# Patient Record
Sex: Male | Born: 1990 | Race: White | Hispanic: No | Marital: Single | Smoking: Current some day smoker
Health system: Southern US, Community
[De-identification: ages and names within clinical notes are randomized; demographics above are authoritative.]

## PROBLEM LIST (undated history)

## (undated) DIAGNOSIS — J45909 Unspecified asthma, uncomplicated: Secondary | ICD-10-CM

## (undated) DIAGNOSIS — F329 Major depressive disorder, single episode, unspecified: Secondary | ICD-10-CM

## (undated) DIAGNOSIS — F419 Anxiety disorder, unspecified: Secondary | ICD-10-CM

## (undated) DIAGNOSIS — F909 Attention-deficit hyperactivity disorder, unspecified type: Secondary | ICD-10-CM

## (undated) DIAGNOSIS — F32A Depression, unspecified: Secondary | ICD-10-CM

## (undated) HISTORY — DX: Depression, unspecified: F32.A

## (undated) HISTORY — PX: TOENAIL EXCISION: SUR558

## (undated) HISTORY — DX: Attention-deficit hyperactivity disorder, unspecified type: F90.9

## (undated) HISTORY — DX: Anxiety disorder, unspecified: F41.9

---
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
## 2008-03-31 ENCOUNTER — Encounter: Admission: RE | Admit: 2008-03-31 | Discharge: 2008-03-31 | Payer: Self-pay | Admitting: Family Medicine

## 2008-03-31 ENCOUNTER — Ambulatory Visit: Payer: Self-pay | Admitting: Family Medicine

## 2008-03-31 DIAGNOSIS — J1100 Influenza due to unidentified influenza virus with unspecified type of pneumonia: Secondary | ICD-10-CM | POA: Insufficient documentation

## 2008-03-31 DIAGNOSIS — R059 Cough, unspecified: Secondary | ICD-10-CM

## 2008-03-31 DIAGNOSIS — R05 Cough: Secondary | ICD-10-CM

## 2008-03-31 HISTORY — DX: Cough, unspecified: R05.9

## 2008-05-16 ENCOUNTER — Ambulatory Visit: Payer: Self-pay | Admitting: Family Medicine

## 2008-05-19 ENCOUNTER — Telehealth: Payer: Self-pay | Admitting: Family Medicine

## 2008-06-02 ENCOUNTER — Ambulatory Visit: Payer: Self-pay | Admitting: Occupational Medicine

## 2008-06-02 DIAGNOSIS — S62309A Unspecified fracture of unspecified metacarpal bone, initial encounter for closed fracture: Secondary | ICD-10-CM

## 2008-06-02 HISTORY — DX: Unspecified fracture of unspecified metacarpal bone, initial encounter for closed fracture: S62.309A

## 2008-06-03 ENCOUNTER — Telehealth (INDEPENDENT_AMBULATORY_CARE_PROVIDER_SITE_OTHER): Payer: Self-pay | Admitting: *Deleted

## 2008-08-01 ENCOUNTER — Encounter: Admission: RE | Admit: 2008-08-01 | Discharge: 2008-08-01 | Payer: Self-pay | Admitting: Orthopedic Surgery

## 2014-03-31 ENCOUNTER — Encounter: Payer: Self-pay | Admitting: Physician Assistant

## 2014-03-31 ENCOUNTER — Ambulatory Visit: Payer: Self-pay | Admitting: Physician Assistant

## 2014-03-31 ENCOUNTER — Ambulatory Visit (INDEPENDENT_AMBULATORY_CARE_PROVIDER_SITE_OTHER): Payer: PRIVATE HEALTH INSURANCE | Admitting: Physician Assistant

## 2014-03-31 VITALS — BP 136/83 | HR 88 | Temp 98.2°F | Wt 213.0 lb

## 2014-03-31 DIAGNOSIS — J209 Acute bronchitis, unspecified: Secondary | ICD-10-CM

## 2014-03-31 DIAGNOSIS — L858 Other specified epidermal thickening: Secondary | ICD-10-CM

## 2014-03-31 DIAGNOSIS — Q828 Other specified congenital malformations of skin: Secondary | ICD-10-CM

## 2014-03-31 MED ORDER — AZITHROMYCIN 250 MG PO TABS: ORAL_TABLET | ORAL | Status: DC

## 2014-03-31 NOTE — Patient Instructions (Addendum)
Acute Bronchitis Bronchitis is inflammation of the airways that extend from the windpipe into the lungs (bronchi). The inflammation often causes mucus to develop. This leads to a cough, which is the most common symptom of bronchitis.  In acute bronchitis, the condition usually develops suddenly and goes away over time, usually in a couple weeks. Smoking, allergies, and asthma can make bronchitis worse. Repeated episodes of bronchitis may cause further lung problems.  CAUSES Acute bronchitis is most often caused by the same virus that causes a cold. The virus can spread from person to person (contagious) through coughing, sneezing, and touching contaminated objects. SIGNS AND SYMPTOMS   Cough.   Fever.   Coughing up mucus.   Body aches.   Chest congestion.   Chills.   Shortness of breath.   Sore throat.  DIAGNOSIS  Acute bronchitis is usually diagnosed through a physical exam. Your health care provider will also ask you questions about your medical history. Tests, such as chest X-rays, are sometimes done to rule out other conditions.  TREATMENT  Acute bronchitis usually goes away in a couple weeks. Oftentimes, no medical treatment is necessary. Medicines are sometimes given for relief of fever or cough. Antibiotic medicines are usually not needed but may be prescribed in certain situations. In some cases, an inhaler may be recommended to help reduce shortness of breath and control the cough. A cool mist vaporizer may also be used to help thin bronchial secretions and make it easier to clear the chest.  HOME CARE INSTRUCTIONS  Get plenty of rest.   Drink enough fluids to keep your urine clear or pale yellow (unless you have a medical condition that requires fluid restriction). Increasing fluids may help thin your respiratory secretions (sputum) and reduce chest congestion, and it will prevent dehydration.   Take medicines only as directed by your health care provider.  If  you were prescribed an antibiotic medicine, finish it all even if you start to feel better.  Avoid smoking and secondhand smoke. Exposure to cigarette smoke or irritating chemicals will make bronchitis worse. If you are a smoker, consider using nicotine gum or skin patches to help control withdrawal symptoms. Quitting smoking will help your lungs heal faster.   Reduce the chances of another bout of acute bronchitis by washing your hands frequently, avoiding people with cold symptoms, and trying not to touch your hands to your mouth, nose, or eyes.   Keep all follow-up visits as directed by your health care provider.  SEEK MEDICAL CARE IF: Your symptoms do not improve after 1 week of treatment.  SEEK IMMEDIATE MEDICAL CARE IF:  You develop an increased fever or chills.   You have chest pain.   You have severe shortness of breath.  You have bloody sputum.   You develop dehydration.  You faint or repeatedly feel like you are going to pass out.  You develop repeated vomiting.  You develop a severe headache. MAKE SURE YOU:   Understand these instructions.  Will watch your condition.  Will get help right away if you are not doing well or get worse. Document Released: 07/28/2004 Document Revised: 11/04/2013 Document Reviewed: 12/11/2012 [facility] Patient Information 2015 [place], [place]. This information is not intended to replace advice given to you by your health care provider. Make sure you discuss any questions you have with your health care provider.   benzoyl peroxide Consider using dial soap to wash with.  Cerave SA/lac-hydrin for keratoisis pilaris.

## 2014-04-01 NOTE — Progress Notes (Signed)
   Subjective:    Patient ID: Calvin Lewis, male    DOB: 05/01/1991, 23 y.o.   MRN: [id number]  HPI Pt is a 23 yo male who presents to the clinic with cough, congestion for 4 days. His chest is starting to feel tight as well. No fever, chills, n/v/d. Cough productive with green/yellow sputum. Works with [employer] and could not work due to being out of breath. No wheezing.   He has bumpy skin on his arms and wants to know what he can do about it. No pain or itchy.   Here to establish care.   .. Active Ambulatory Problems    Diagnosis Date Noted  . 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 INFLUENZA WITH PNEUMONIA 03/31/2008  . COUGH 03/31/2008  . CLOSED FRACTURE METACARPAL BONE SITE UNSPECIFIED 06/02/2008  . Keratosis pilaris 03/31/2014   Resolved Ambulatory Problems    Diagnosis Date Noted  . No Resolved Ambulatory Problems   No Additional Past Medical History   .Marland Kitchen. Family History  Problem Relation Age of Onset  . Alcohol abuse Mother   . Depression Mother   . Hyperlipidemia Mother   . Alcohol abuse Father   . Heart attack Father   . Hyperlipidemia Father   . Hypertension Father    .Marland Kitchen. History   Social History  . Marital Status: Single    Spouse Name: N/A    Number of Children: N/A  . Years of Education: N/A   Occupational History  . Not on file.   Social History Main Topics  . Smoking status: Current Some Day Smoker  . Smokeless tobacco: Not on file  . Alcohol Use: Yes  . Drug Use: No  . Sexual Activity: Yes   Other Topics Concern  . Not on file   Social History Narrative  . No narrative on file      Review of Systems  All other systems reviewed and are negative.      Objective:   Physical Exam  Constitutional: He is oriented to person, place, and time. He appears well-developed and well-nourished.  HENT:  Head: Normocephalic and atraumatic.  Right Ear: External ear normal.  Left Ear: External ear normal.  Nose: Nose normal.  Mouth/Throat:  Oropharynx is clear and moist. No oropharyngeal exudate.  Eyes: Conjunctivae are normal.  Neck: Normal range of motion. Neck supple.  Cardiovascular: Normal rate, regular rhythm and normal heart sounds.   Pulmonary/Chest: Effort normal and breath sounds normal. He has no wheezes.  Lymphadenopathy:    He has no cervical adenopathy.  Neurological: He is alert and oriented to person, place, and time.  Skin: Skin is dry.  Rough bumpy skin on bilateral lateral arms.   Psychiatric: He has a normal mood and affect. His behavior is normal.          Assessment & Plan:  Acute bronchitis- treated with zpak. HO given. Follow up if worsening or not improving.  Keratosis pilaris- discussed cerave SA or lac-hydrin.

## 2014-04-11 ENCOUNTER — Encounter: Payer: Self-pay | Admitting: Sports Medicine

## 2014-04-11 ENCOUNTER — Ambulatory Visit (INDEPENDENT_AMBULATORY_CARE_PROVIDER_SITE_OTHER): Payer: PRIVATE HEALTH INSURANCE

## 2014-04-11 ENCOUNTER — Ambulatory Visit (INDEPENDENT_AMBULATORY_CARE_PROVIDER_SITE_OTHER): Payer: PRIVATE HEALTH INSURANCE | Admitting: Sports Medicine

## 2014-04-11 VITALS — BP 142/89 | HR 97 | Ht 68.5 in | Wt 214.0 lb

## 2014-04-11 DIAGNOSIS — R05 Cough: Secondary | ICD-10-CM

## 2014-04-11 DIAGNOSIS — R0602 Shortness of breath: Secondary | ICD-10-CM

## 2014-04-11 DIAGNOSIS — R059 Cough, unspecified: Secondary | ICD-10-CM

## 2014-04-11 MED ORDER — BENZONATATE 200 MG PO CAPS: 200.0000 mg | ORAL_CAPSULE | Freq: Three times a day (TID) | ORAL | Status: DC | PRN

## 2014-04-11 MED ORDER — ALBUTEROL SULFATE HFA 108 (90 BASE) MCG/ACT IN AERS: 2.0000 | INHALATION_SPRAY | Freq: Four times a day (QID) | RESPIRATORY_TRACT | Status: DC | PRN

## 2014-04-11 NOTE — Assessment & Plan Note (Signed)
Symptoms do sound like bronchitis, he continues to smoke. Albuterol, chest x-ray, lungs are clear. Benzonatate. Return in 2 weeks if no better at that point we should probably consider pre-and post bronchodilator spirometry.

## 2014-04-11 NOTE — Progress Notes (Signed)
  Subjective:    CC: Cough  HPI: Calvin Lewis is a very pleasant 23 year old male, for the past several week or so he has had a cough, no fevers, chills, mild shortness of breath. He does continue to smoke. Approximately one pack a day, does not desire to quit. No constitutional symptoms. Symptoms are moderate, persistent, cough is nonproductive.  Past medical history, Surgical history, Family history not pertinant except as noted below, Social history, Allergies, and medications have been entered into the medical record, reviewed, and no changes needed.   Review of Systems: No fevers, chills, night sweats, weight loss, chest pain, or shortness of breath.   Objective:    General: Well Developed, well nourished, and in no acute distress.  Neuro: Alert and oriented x3, extra-ocular muscles intact, sensation grossly intact.  HEENT: Normocephalic, atraumatic, pupils equal round reactive to light, neck supple, no masses, no lymphadenopathy, thyroid nonpalpable.  Skin: Warm and dry, no rashes. Cardiac: Regular rate and rhythm, no murmurs rubs or gallops, no lower extremity edema.  Respiratory: Clear to auscultation bilaterally. Not using accessory muscles, speaking in full sentences.  Chest x-ray is clear  Impression and Recommendations:

## 2014-04-25 ENCOUNTER — Ambulatory Visit: Payer: PRIVATE HEALTH INSURANCE | Admitting: Physician Assistant

## 2014-05-17 NOTE — Addendum Note (Signed)
Addended by: Chalmers CaterUTTLE, Donathan Buller H on: 05/17/2014 11:10 AM   Modules accepted: Orders

## 2014-05-21 ENCOUNTER — Ambulatory Visit: Payer: PRIVATE HEALTH INSURANCE | Admitting: Physician Assistant

## 2014-05-21 DIAGNOSIS — Z0289 Encounter for other administrative examinations: Secondary | ICD-10-CM

## 2014-06-18 ENCOUNTER — Encounter: Payer: Self-pay | Admitting: Physician Assistant

## 2014-06-18 ENCOUNTER — Ambulatory Visit (INDEPENDENT_AMBULATORY_CARE_PROVIDER_SITE_OTHER): Payer: PRIVATE HEALTH INSURANCE | Admitting: Physician Assistant

## 2014-06-18 VITALS — BP 133/76 | HR 80 | Ht 68.5 in | Wt 204.0 lb

## 2014-06-18 DIAGNOSIS — S92302D Fracture of unspecified metatarsal bone(s), left foot, subsequent encounter for fracture with routine healing: Secondary | ICD-10-CM

## 2014-06-18 HISTORY — DX: Fracture of unspecified metatarsal bone(s), left foot, subsequent encounter for fracture with routine healing: S92.302D

## 2014-06-18 MED ORDER — HYDROCODONE-ACETAMINOPHEN 7.5-325 MG PO TABS: 1.0000 | ORAL_TABLET | Freq: Three times a day (TID) | ORAL | Status: DC | PRN

## 2014-06-18 NOTE — Progress Notes (Signed)
   Subjective:    Patient ID: Calvin Lewis, male    DOB: 11/17/1990, 23 y.o.   MRN: [id number]  HPI  Pt is a 23 yo male who presents to the clinic for hospital follow up after jumping of ledge and landing on left foot. [facility] and confirmed 1st metatarsal fracture. Given naprosyn and norco and placed in post-op boot. Continues to have 5/10 pain. Feels like norco is not working like it should. On crutches currently.     Review of Systems  All other systems reviewed and are negative.      Objective:   Physical Exam  Constitutional: He is oriented to person, place, and time. He appears well-developed and well-nourished.  Cardiovascular: Normal rate, regular rhythm and normal heart sounds.   Musculoskeletal:  Pain to palpation over base of 1st metatarsal with corresponding bruise on plantar aspect of left foot.  Swollen and more bruising noted over base of 2nd-4th metatarsals.  Normal ROM at ankle.  Sensations intact.   Neurological: He is alert and oriented to person, place, and time.  Psychiatric: He has a normal mood and affect. His behavior is normal.          Assessment & Plan:  Closed fracture of 1st metatarsal- increased norco to 7.5/325. Stop naprosyn may increase healing time. Stay in post-op shoe for next 4 weeks. Pt has to wear closed toe shoes at work. Will write out for 4 weeks until follow up and re-xray and make sure can bear weight. Can use crutches for next week then bear weight as tolerated.

## 2014-06-18 NOTE — Patient Instructions (Signed)
7-14 non weight bearing with crutches.  4 weeks re xray and evaluate to get out of post-op boot.

## 2014-06-25 ENCOUNTER — Ambulatory Visit: Payer: PRIVATE HEALTH INSURANCE | Admitting: Physician Assistant

## 2014-06-25 DIAGNOSIS — Z0289 Encounter for other administrative examinations: Secondary | ICD-10-CM

## 2014-07-28 ENCOUNTER — Ambulatory Visit (INDEPENDENT_AMBULATORY_CARE_PROVIDER_SITE_OTHER): Payer: PRIVATE HEALTH INSURANCE

## 2014-07-28 ENCOUNTER — Encounter: Payer: Self-pay | Admitting: Physician Assistant

## 2014-07-28 ENCOUNTER — Ambulatory Visit (INDEPENDENT_AMBULATORY_CARE_PROVIDER_SITE_OTHER): Payer: PRIVATE HEALTH INSURANCE | Admitting: Physician Assistant

## 2014-07-28 VITALS — BP 125/75 | HR 102 | Wt 210.0 lb

## 2014-07-28 DIAGNOSIS — S92302D Fracture of unspecified metatarsal bone(s), left foot, subsequent encounter for fracture with routine healing: Secondary | ICD-10-CM

## 2014-07-28 MED ORDER — HYDROCODONE-ACETAMINOPHEN 7.5-325 MG PO TABS: 1.0000 | ORAL_TABLET | Freq: Three times a day (TID) | ORAL | Status: DC | PRN

## 2014-07-29 NOTE — Progress Notes (Signed)
   Subjective:    Patient ID: Calvin Lewis, male    DOB: 12/20/1990, 24 y.o.   MRN: [id number]  HPI Pt presents to the clinic for 4 week follow up on left foot fracture. Not gone back to work. Took himself out of post-op boot and wearing normal shoes. Still has days of pain. Worse when been on feet a lot. Only using hydrocodone for pain.    Review of Systems  All other systems reviewed and are negative.      Objective:   Physical Exam  Musculoskeletal:  Left foot- Good ROM and strength.  No swelling or bruising.  Some discomfort with palpation over base of 1st metatarsal to deep palpation.           Assessment & Plan:  Closed fracture of left foot- Xray done today. Appears to be healing well. Pt is already taken himself out of post-op boot. Still has some residual foot pain worse on some days than others. Can add NSAID back at this point since healed. Refilled a few more hydrocodone for acute pain relief. Discussed will not give any more. I do think he is released to go back to work wearing normal shoes. Follow up as needed.

## 2015-03-11 ENCOUNTER — Ambulatory Visit (INDEPENDENT_AMBULATORY_CARE_PROVIDER_SITE_OTHER): Payer: PRIVATE HEALTH INSURANCE | Admitting: Family Medicine

## 2015-03-11 ENCOUNTER — Encounter: Payer: Self-pay | Admitting: Family Medicine

## 2015-03-11 VITALS — BP 134/89 | HR 80 | Temp 98.1°F | Ht 68.5 in | Wt 207.0 lb

## 2015-03-11 DIAGNOSIS — J209 Acute bronchitis, unspecified: Secondary | ICD-10-CM | POA: Diagnosis not present

## 2015-03-11 HISTORY — DX: Acute bronchitis, unspecified: J20.9

## 2015-03-11 MED ORDER — AZITHROMYCIN 250 MG PO TABS: 250.0000 mg | ORAL_TABLET | Freq: Every day | ORAL | Status: DC

## 2015-03-11 MED ORDER — PREDNISONE 10 MG PO TABS: 30.0000 mg | ORAL_TABLET | Freq: Every day | ORAL | Status: DC

## 2015-03-11 MED ORDER — GUAIFENESIN-CODEINE 100-10 MG/5ML PO SOLN: 5.0000 mL | Freq: Every evening | ORAL | Status: DC | PRN

## 2015-03-11 NOTE — Progress Notes (Signed)
Calvin Lewis is a 24 y.o. male who presents to [facility]  today for cough congestion sore throat and sinus pressure. Symptoms present for 5 days. He uses albuterol inhaler which helped a little. Cough is productive. Patient notes minimal wheezing but does not note significant shortness of breath. No chest pains or palpitations. He's tried some over-the-counter medicines which helped some too.   No past medical history on file. No past surgical history on file. Social History  Substance Use Topics  . Smoking status: Current Some Day Smoker  . Smokeless tobacco: Not on file  . Alcohol Use: Yes   family history includes Alcohol abuse in his father and mother; Depression in his mother; Heart attack in his father; Hyperlipidemia in his father and mother; Hypertension in his father.  ROS as above Medications: Current Outpatient Prescriptions  Medication Sig Dispense Refill  . albuterol (PROVENTIL HFA;VENTOLIN HFA) 108 (90 BASE) MCG/ACT inhaler Inhale 2 puffs into the lungs every 6 (six) hours as needed for wheezing. 2 Inhaler 11  . azithromycin (ZITHROMAX) 250 MG tablet Take 1 tablet (250 mg total) by mouth daily. Take first 2 tablets together, then 1 every day until finished. 6 tablet 0  . guaiFENesin-codeine 100-10 MG/5ML syrup Take 5 mLs by mouth at bedtime as needed for cough. 120 mL 0  . predniSONE (DELTASONE) 10 MG tablet Take 3 tablets (30 mg total) by mouth daily. 15 tablet 0   No current facility-administered medications for this visit.   No Known Allergies   Exam:  BP 134/89 mmHg  Pulse 80  Temp(Src) 98.1 F (36.7 C) (Oral)  Ht 5' 8.5" (1.74 m)  Wt 207 lb (93.895 kg)  BMI 31.01 kg/m2  SpO2 99% Gen: Well NAD HEENT: EOMI,  MMM posterior pharynx with cobblestoning. Normal tympanic membranes bilaterally. Lungs: Normal work of breathing. CTABL Heart: RRR no MRG Abd: NABS, Soft. Nondistended, Nontender Exts: Brisk capillary refill, warm and  well perfused.   No results found for this or any previous visit (from the past 24 hour(s)). No results found.   Assessment and plan: 24 year old male with bronchitis. Likely viral. Treat with prednisone and codeine cough syrup. Patient declined Atrovent nasal spray. Use azithromycin if not better.

## 2015-03-11 NOTE — Patient Instructions (Signed)
Thank you for coming in today. Call or go to the emergency room if you get worse, have trouble breathing, have chest pains, or palpitations.  Use albuterol as needed.  Take prednisone daily.  Use azithromycin if not better.   Acute Bronchitis Bronchitis is inflammation of the airways that extend from the windpipe into the lungs (bronchi). The inflammation often causes mucus to develop. This leads to a cough, which is the most common symptom of bronchitis.  In acute bronchitis, the condition usually develops suddenly and goes away over time, usually in a couple weeks. Smoking, allergies, and asthma can make bronchitis worse. Repeated episodes of bronchitis may cause further lung problems.  CAUSES Acute bronchitis is most often caused by the same virus that causes a cold. The virus can spread from person to person (contagious) through coughing, sneezing, and touching contaminated objects. SIGNS AND SYMPTOMS   Cough.   Fever.   Coughing up mucus.   Body aches.   Chest congestion.   Chills.   Shortness of breath.   Sore throat.  DIAGNOSIS  Acute bronchitis is usually diagnosed through a physical exam. Your health care provider will also ask you questions about your medical history. Tests, such as chest X-rays, are sometimes done to rule out other conditions.  TREATMENT  Acute bronchitis usually goes away in a couple weeks. Oftentimes, no medical treatment is necessary. Medicines are sometimes given for relief of fever or cough. Antibiotic medicines are usually not needed but may be prescribed in certain situations. In some cases, an inhaler may be recommended to help reduce shortness of breath and control the cough. A cool mist vaporizer may also be used to help thin bronchial secretions and make it easier to clear the chest.  HOME CARE INSTRUCTIONS  Get plenty of rest.   Drink enough fluids to keep your urine clear or pale yellow (unless you have a medical condition that  requires fluid restriction). Increasing fluids may help thin your respiratory secretions (sputum) and reduce chest congestion, and it will prevent dehydration.   Take medicines only as directed by your health care provider.  If you were prescribed an antibiotic medicine, finish it all even if you start to feel better.  Avoid smoking and secondhand smoke. Exposure to cigarette smoke or irritating chemicals will make bronchitis worse. If you are a smoker, consider using nicotine gum or skin patches to help control withdrawal symptoms. Quitting smoking will help your lungs heal faster.   Reduce the chances of another bout of acute bronchitis by washing your hands frequently, avoiding people with cold symptoms, and trying not to touch your hands to your mouth, nose, or eyes.   Keep all follow-up visits as directed by your health care provider.  SEEK MEDICAL CARE IF: Your symptoms do not improve after 1 week of treatment.  SEEK IMMEDIATE MEDICAL CARE IF:  You develop an increased fever or chills.   You have chest pain.   You have severe shortness of breath.  You have bloody sputum.   You develop dehydration.  You faint or repeatedly feel like you are going to pass out.  You develop repeated vomiting.  You develop a severe headache. MAKE SURE YOU:   Understand these instructions.  Will watch your condition.  Will get help right away if you are not doing well or get worse. Document Released: 07/28/2004 Document Revised: 11/04/2013 Document Reviewed: 12/11/2012 [facility] Patient Information 2015 [place], [place]. This information is not intended to replace advice given to you  by your health care provider. Make sure you discuss any questions you have with your health care provider.  

## 2015-07-09 ENCOUNTER — Encounter: Payer: Self-pay | Admitting: Osteopathic Medicine

## 2015-07-09 ENCOUNTER — Ambulatory Visit (INDEPENDENT_AMBULATORY_CARE_PROVIDER_SITE_OTHER): Payer: PRIVATE HEALTH INSURANCE | Admitting: Osteopathic Medicine

## 2015-07-09 VITALS — BP 134/81 | HR 103 | Temp 97.9°F | Wt 210.0 lb

## 2015-07-09 DIAGNOSIS — J111 Influenza due to unidentified influenza virus with other respiratory manifestations: Secondary | ICD-10-CM | POA: Diagnosis not present

## 2015-07-09 DIAGNOSIS — J029 Acute pharyngitis, unspecified: Secondary | ICD-10-CM | POA: Diagnosis not present

## 2015-07-09 DIAGNOSIS — R69 Illness, unspecified: Principal | ICD-10-CM

## 2015-07-09 DIAGNOSIS — J019 Acute sinusitis, unspecified: Secondary | ICD-10-CM

## 2015-07-09 DIAGNOSIS — R059 Cough, unspecified: Secondary | ICD-10-CM

## 2015-07-09 DIAGNOSIS — R52 Pain, unspecified: Secondary | ICD-10-CM | POA: Diagnosis not present

## 2015-07-09 DIAGNOSIS — R05 Cough: Secondary | ICD-10-CM

## 2015-07-09 LAB — POCT RAPID STREP A (OFFICE): RAPID STREP A SCREEN: NEGATIVE

## 2015-07-09 LAB — POCT INFLUENZA A/B
INFLUENZA A, POC: NEGATIVE
INFLUENZA B, POC: NEGATIVE

## 2015-07-09 MED ORDER — AMBULATORY NON FORMULARY MEDICATION: Status: DC

## 2015-07-09 MED ORDER — IPRATROPIUM BROMIDE 0.03 % NA SOLN: 2.0000 | Freq: Two times a day (BID) | NASAL | Status: DC

## 2015-07-09 MED ORDER — GUAIFENESIN-CODEINE 100-10 MG/5ML PO SYRP: 5.0000 mL | ORAL_SOLUTION | Freq: Three times a day (TID) | ORAL | Status: DC | PRN

## 2015-07-09 NOTE — Patient Instructions (Signed)
DR. Carvel Huskins'S HOME CARE INSTRUCTIONS: UPPER RESPIRATORY ILLNESS AND SINUSITIS   FRIST, A FEW NOTES ON OVER-THE-COUNTER MEDICATIONS!  . USE CAUTION - MANY OVER-THE-COUNTER MEDICATIONS COME IN COMBINATIONS OF MULTIPLE GENERICS. FOR INSTANCE, NYQUIL HAS TYLENOL + COUGH MEDICINE + AN ANTIHISTAMINE, SO BE CAREFUL YOU'RE NOT TAKING A COMBINATION MEDICINE WHICH CONTAINS MEDICATIONS YOU'RE ALSO TAKING SEPARATELY (LIKE NYQUIL SYRUP AS WELL AS TYLENOL PILLS).  . YOUR PHARMACIST CAN HELP YOU AVOID MEDICATION INTERACTIONS AND DUPLICATIONS - ASK FOR THEIR HELP IF YOU ARE CONFUSED OR UNSURE ABOUT WHAT TO PURCHASE OVER-THE-COUNTER!  . REMEMBER - IF YOU'RE EVER CONCERNED ABOUT MEDICATION SIDE EFFECTS, OR IF YOU'RE EVER CONCERNED YOUR SYMPTOMS ARE GETTING WORSE DESPITE TREATMENT, PLEASE CALL THE OFFICE!   TREAT SINUS CONGESTION, RUNNY NOSE & POSTNASAL DRIP: . Treat to increase airflow through sinuses, decrease congestion pain and prevent bacterial growth!  . Remember, only 0.5-2% of sinus infections are due to a bacteria, the rest are due to a virus (usually the common cold)! Trust your doctor when he or she decides whether or not you really need an antibiotic!   NASAL SPRAYS: generally safe and should not interact with other medicines. Can take any of these medications, either alone or together... FLONASE (FLUTICASONE) - 2 sprays in each nostril twice per day (also a great allergy medicine to use long-term!) AFRIN (OXYMETOLAZONE) - use sparingly because it will cause rebound congestion, NEVER USE IN KIDS   SALINE NASAL SPRAY- no limit, but avoid use after other nasal sprays or it can wash the medicine away  ANTIHISTAMINES: Helps dry out runny nose and decreases postnasal drip. Benadryl may cause drowsiness but other preparations should not be as sedating. Certain kinds are not as safe in elderly individuals. OK to use unless Dr A says otherwise.  Only use one of the following... BENADRYL (DIPHENHYDRAMINE) -  25-50 mg every 6 hours ZYRTEC (CETIRIZINE) - 5-10 mg daily CLARITIN (LORATIDINE) - less potent. 10 mg daily ALLEGRA (FEXOFENADINE) - least likely to cause drowsiness! 180 mg daily or 60 mg twice per day  DECONGESTANTS: Helps dry out runny nose and helps with sinus pain. May cause insomnia, or sometimes elevated heart rate. Can cause problems if used often in people with high blood pressure. OK to use unless Dr A says otherwise. NEVER USE IN KIDS UNDER 2 YEARS OLD. Only use one of the following... SUDAFED (PSEUDOEPHEDINE) - 60 mg every 4 - 6 hours, also comes in 120 mg extended release every 12 hours, maximum 240 mg in 24 hours.  SUDAED PE (PHENYLEPHRINE) - 10 mg every 4 - 6 hours, maximum 60 mg per day  COMBINATIONS OF ANTIHISTAMINE + DECONGESTANT: these usually require you to show your ID at the pharmacy counter. You can also purchase these medicines separately as noted above.  Only use one of the following... ZYRTEC-D (CETIRIZINE + PSEUDOEPHEDRINE) - 12 hour formulation as directed CLARITIN-D (LORATIDINE + PSEUDOEPHEDRINE) - 12 and 24 hour formulations as directed ALLEGRA-D (FEXOFENADINE + PSEUDOEPHEDRINE) - 12 and 24 hour formulations as directed  PRESCRIPTION TREATMENT FOR SINUS PROBLEMS: Can include nasal sprays, pills, or antibiotics in the case of true bacterial infection. Not everyone needs an antibiotic but there are other medicines which will help you feel better while your body fights the infection!   TREAT COUGH & SORE THROAT: . Remember, cough is the body's way of protecting your airways and lungs, it's a hard-wired reflex that is tough for medicines to treat!  . Irritation to the airways will cause   cough. This irritation is usually caused by upper airway problems like postnasal drip (treat as above) and viral sore throat, but in severe cases can be due to lower airway problems like bronchitis or pneumonia, which a doctor can usually hear on exam of your lungs or an X-ray. . Sore  throat is almost always due to a virus, but occasionally caused by Strep, which requires antibiotics.  . Exercise and smoking may make cough worse - take it easy while you're sick, and QUIT SMOKING!   . Cough due to viral infection can linger for 2 weeks or so. If you're coughing longer than you think you should, or if the cough is severe, please make an appointment in the office - you may need a chest X-ray.   EXPECTORANT: Used to help clear airways, take these with PLENTY of water to help thin mucus secretions and make the mucus easier to cough up   Only use one of the following... ROBITUSSIN (DEXTROMETHORPHAN OR GUAIFENISEN depending on formulation)  MUCINEX (GUAIFENICEN) - usually longer acting  COUGH DROPS/LOZENGES: Whichever over-the-counter agent you prefer!  Here are some suggestions for ingredients to look for (can take both)... BENZOCAINE - numbing effect, also in CHLORASEPTIC THROAT SPRAY MENTHOL - cooling effect  HONEY: has gone head-to-head in several clinical trials with prescription cough medicines and found to be equally effective! Try 1 Teaspoon Honey + 2 Drops Lemon Juice, as much as you want to use. NONE FOR KIDS UNDER AGE 2  HERBAL TEA: There are certain ingredients which help "coat the throat" to relieve pain  such as ELM BARK, LICORICE ROOT, MARSHMALLOW ROOT  PRESCRIPTION TREATMENT FOR COUGH: Reserved for severe cases. Can include pills, syrups or inhalers.    TREAT ACHES & PAINS, FEVER: . Illness causes aches, pains and fever as your body increases its natural inflammation response to help fight the infection.  . Rest, good hydration and nutrition, and taking anti-inflammatory medications will help.  . Remember: a true fever is a temperature 100.4 or higher. If you have a fever that is 105.0 or higher, that is a dangerous level and needs medical attention in the office or in the ER!    Can take both of these together... IBUPROFEN - 400-800 mg every 6 - 8 hours.  Ibuprofen and similar medications can cause problems for people with heart disease or history of stomach ulcers, check with Dr A first if you're concerned. Lower doses are usually safe and effective.  TYLENOL (ACETAMINOPHEN) - 500-1000 mg every 6 hours. It won't cause problems with heart or stomach.     REMEMBER - THE MOST IMPORTANT THINGS YOU CAN DO TO AVOID CATCHING OR SPREADING ILLNESS INCLUDE:  . COVER YOUR COUGH WITH YOUR ARM, NOT WITH YOUR HANDS!  . DISINFECT COMMONLY USED SURFACES (SUCH AS TELEPHONES & DOORKNOBS) WHEN YOU OR SOMEONE CLOSE TO YOU IS FEELING SICK!  . BE SURE VACCINES ARE UP TO DATE - GET A FLU SHOT EVERY YEAR! . GOOD NUTRITION AND HEALTHY LIFESTYLE WILL HELP YOUR IMMUNE SYSTEM YEAR-ROUND! . AND ABOVE ALL - WASH YOUR HANDS! 

## 2015-07-09 NOTE — Progress Notes (Signed)
HPI: Calvin Lewis is a 25 y.o. male who [facility] [place]  today for chief complaint of:  Chief Complaint  Patient presents with  . Sore Throat  . Cough    . Location: sinuses, throat . Quality: pressure, sore throat, body aches  . Severity: mild to moderate . Duration: 1 days . Context: no sick contacts, no recent travel . Modifying factors: has tried the following OTC medications: none without relief . Assoc signs/symptoms: no fever/chills, no productive cough, Yes  body aches, normal GI upset form    Past medical, social and family history reviewed: No past medical history on file. No past surgical history on file. Social History  Substance Use Topics  . Smoking status: Current Some Day Smoker  . Smokeless tobacco: Not on file  . Alcohol Use: Yes   Family History  Problem Relation Age of Onset  . Alcohol abuse Mother   . Depression Mother   . Hyperlipidemia Mother   . Alcohol abuse Father   . Heart attack Father   . Hyperlipidemia Father   . Hypertension Father     Current Outpatient Prescriptions  Medication Sig Dispense Refill  . albuterol (PROVENTIL HFA;VENTOLIN HFA) 108 (90 BASE) MCG/ACT inhaler Inhale 2 puffs into the lungs every 6 (six) hours as needed for wheezing. 2 Inhaler 11   No current facility-administered medications for this visit.   No Known Allergies    Review of Systems: CONSTITUTIONAL: no fever/chills HEAD/EYES/EARS/NOSE/THROAT: yes headache, no vision change or hearing change, yes sore throat CARDIAC: No chest pain/pressure/palpitations, no orthopnea RESPIRATORY: yes cough, no shortness of breath GASTROINTESTINAL: yes nausea, no vomiting, no abdominal pain/blood in stool/diarrhea/constipation MUSCULOSKELETAL: yes myalgia/arthralgia   Exam:  BP 134/81 mmHg  Pulse 103  Temp(Src) 97.9 F (36.6 C) (Oral)  Wt 210 lb (95.255 kg)  SpO2 99% Constitutional: VSS, see above. General Appearance: alert,  well-developed, well-nourished, NAD Eyes: Normal lids and conjunctive, non-icteric sclera, PERRLA Ears, Nose, Mouth, Throat: Normal external inspection ears/nares/mouth/lips/gums, normal TM, MMM; posterior pharynx with erythema, without exudate Neck: No masses, trachea midline. No thyroid enlargement/tenderness/mass appreciated, normal lymph nodes Respiratory: Normal respiratory effort. No  wheeze/rhonchi/rales Cardiovascular: S1/S2 normal, no murmur/rub/gallop auscultated. RRR. No carotid bruit or JVD. No lower extremity edema.   Results for orders placed or performed in visit on 07/09/15 (from the past 72 hour(s))  POCT rapid strep A     Status: None   Collection Time: 07/09/15  3:37 PM  Result Value Ref Range   Rapid Strep A Screen Negative Negative  POCT Influenza A/B     Status: None   Collection Time: 07/09/15  3:37 PM  Result Value Ref Range   Influenza A, POC Negative Negative   Influenza B, POC Negative Negative     ASSESSMENT/PLAN: OTC med list given to patient as well as cough meds and Atrovent spray  Influenza-like illness - Plan: AMBULATORY NON FORMULARY MEDICATION - work note given  Sore throat - Plan: POCT rapid strep A, POCT Influenza A/B  Cough - Plan: POCT Influenza A/B, guaiFENesin-codeine (ROBITUSSIN AC) 100-10 MG/5ML syrup  Body aches - Plan: POCT Influenza A/B  Acute sinusitis, recurrence not specified, unspecified location - Plan: ipratropium (ATROVENT) 0.03 % nasal spray    Return if symptoms worsen or fail to improve.

## 2016-06-19 IMAGING — CR DG CHEST 2V
2 series · 2 of 2 positions shown · non-contrast
Comparison: 03/31/2008

CLINICAL DATA: Cough

EXAM:
CHEST  2 VIEW

[view not recorded (1 of 2)]
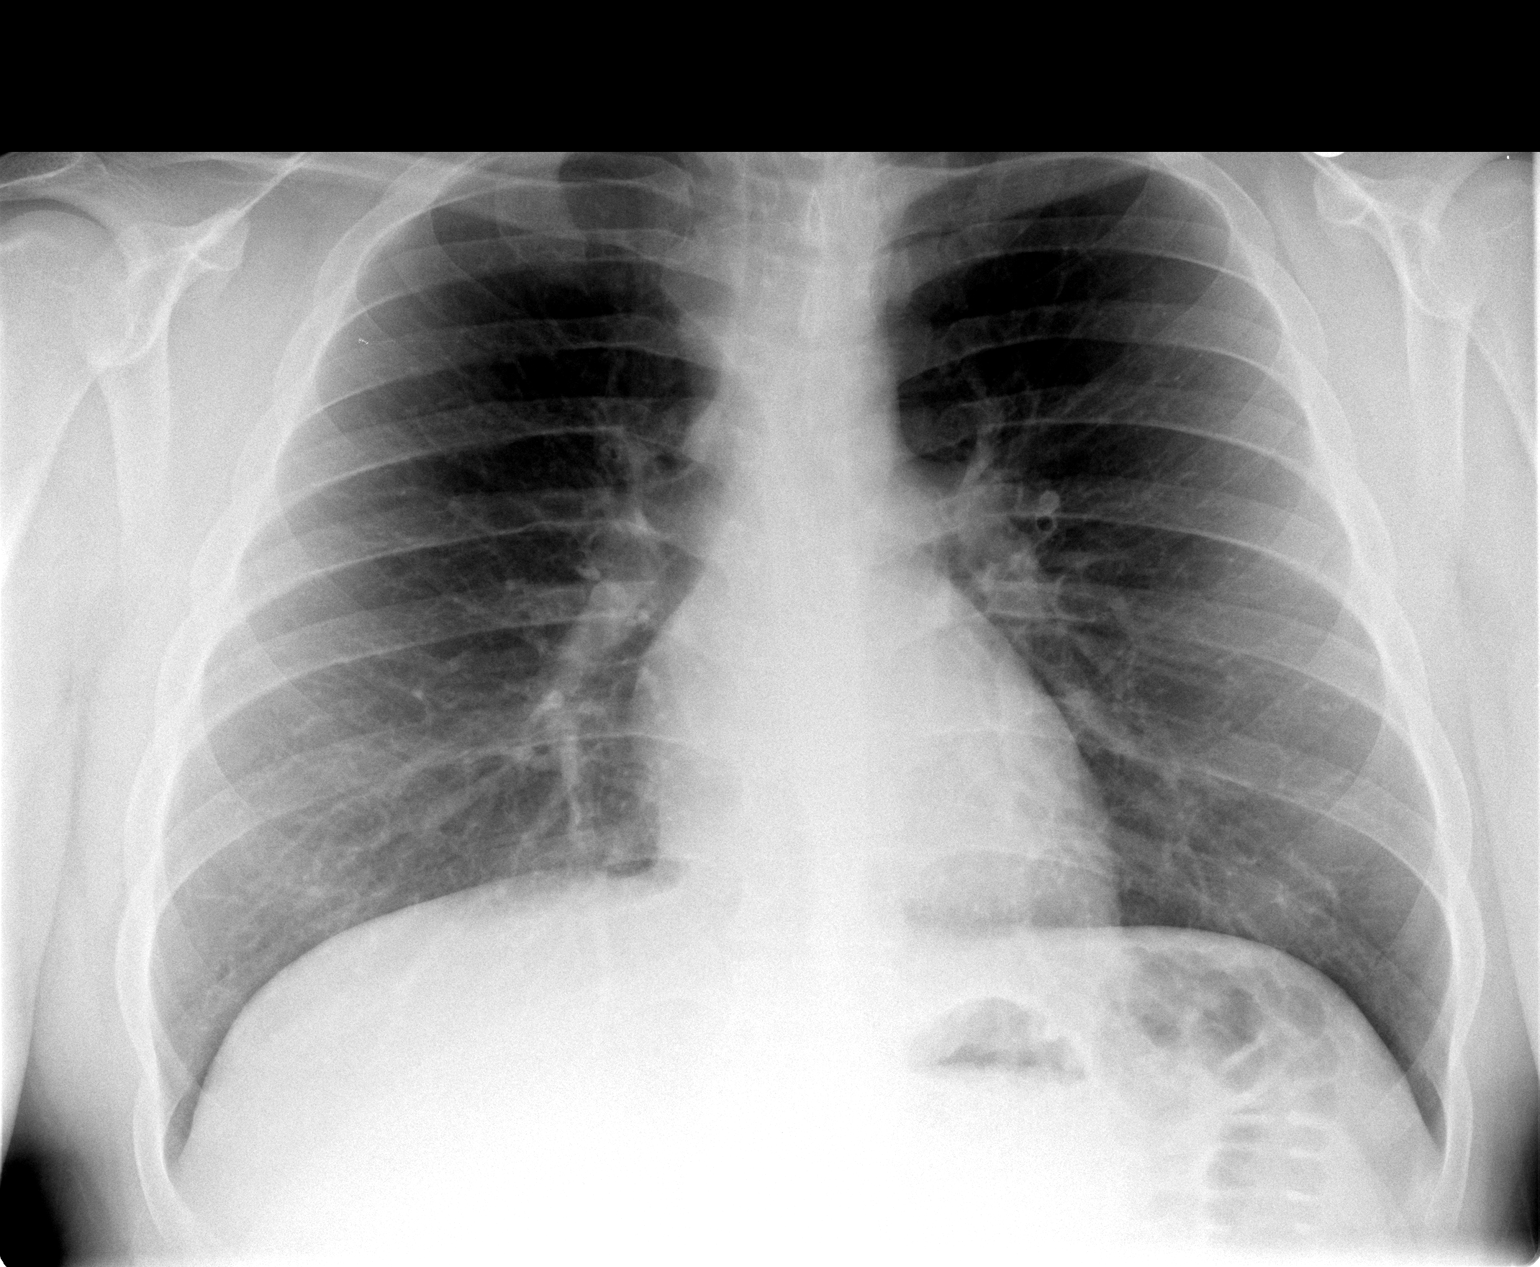

[view not recorded (2 of 2)]
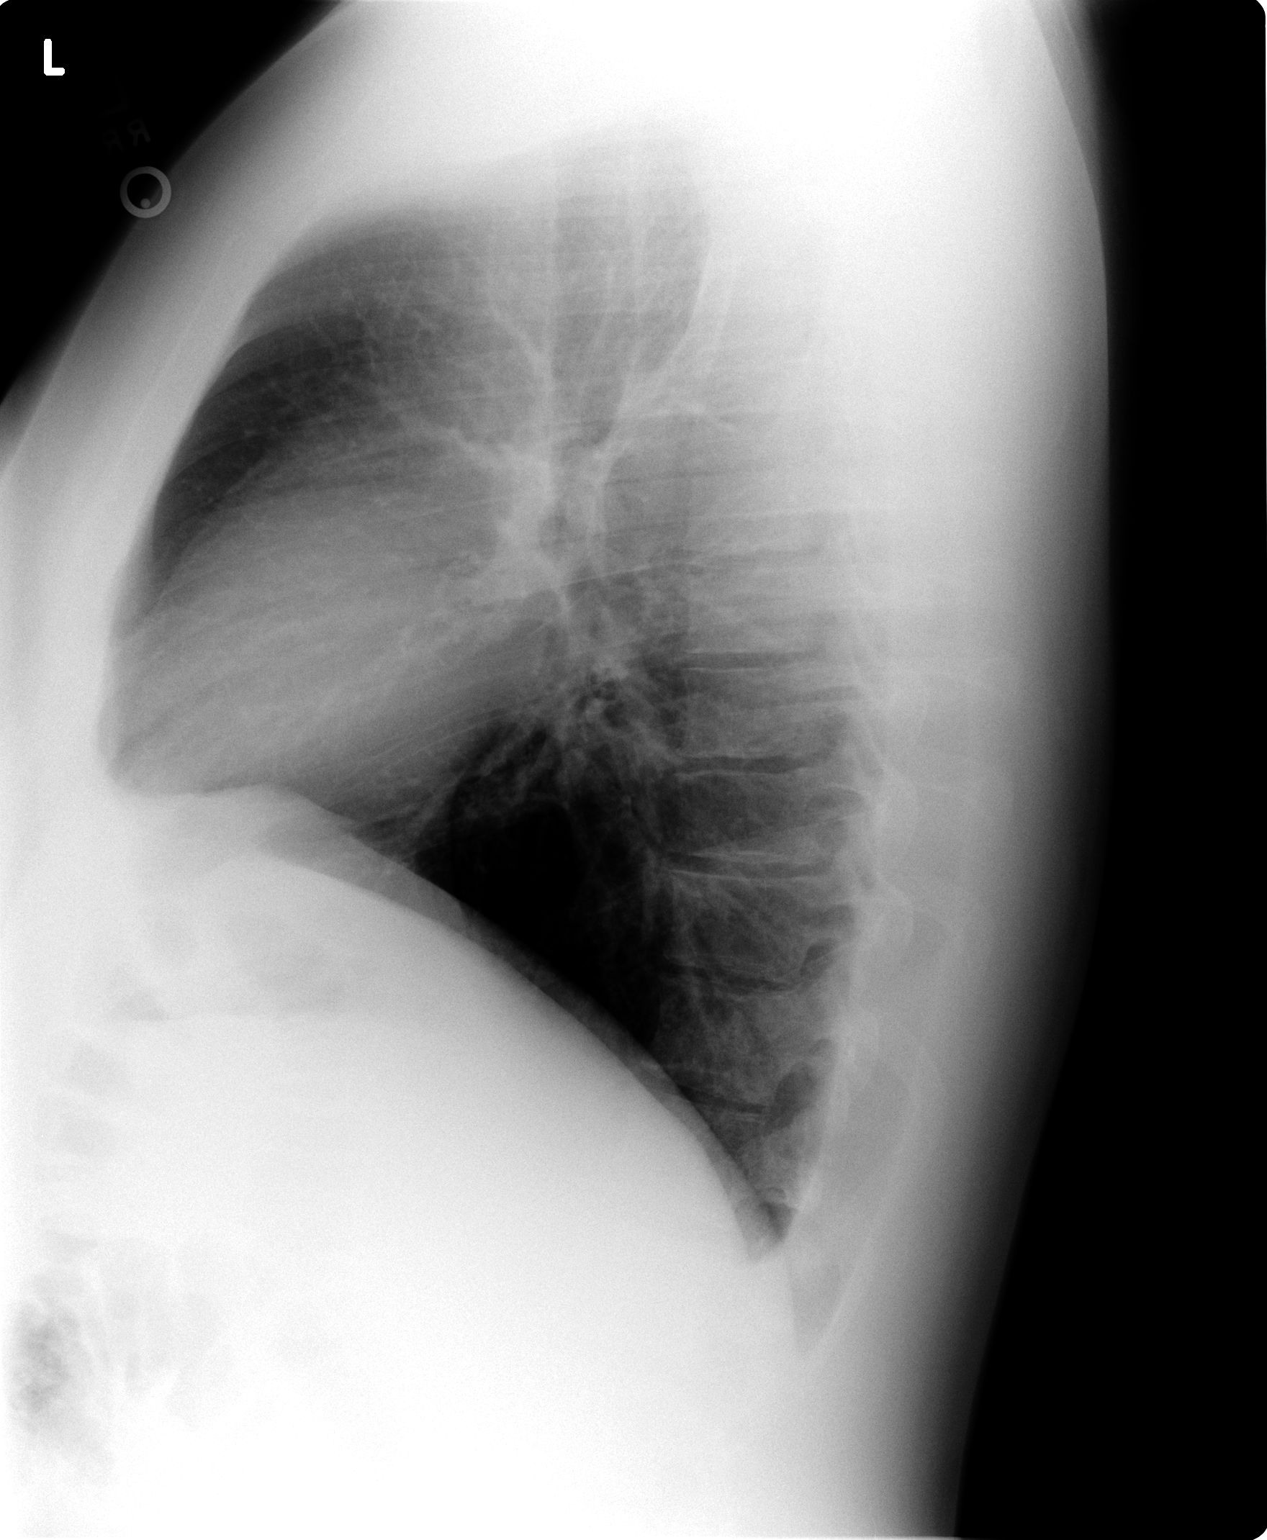

[2 of 2 positions shown; findings below may reference images not displayed]

FINDINGS: The heart size and mediastinal contours are within normal limits.
Both lungs are clear. The visualized skeletal structures are
unremarkable.
IMPRESSION: No active cardiopulmonary disease.

## 2016-09-19 ENCOUNTER — Emergency Department (HOSPITAL_COMMUNITY)
Admission: EM | Admit: 2016-09-19 | Discharge: 2016-09-19 | Disposition: A | Discharge status: Home / Self Care | Payer: PRIVATE HEALTH INSURANCE | Attending: Emergency Medicine | Admitting: Emergency Medicine

## 2016-09-19 ENCOUNTER — Encounter (HOSPITAL_COMMUNITY): Payer: Self-pay | Admitting: Emergency Medicine

## 2016-09-19 DIAGNOSIS — R002 Palpitations: Secondary | ICD-10-CM | POA: Insufficient documentation

## 2016-09-19 DIAGNOSIS — F17210 Nicotine dependence, cigarettes, uncomplicated: Secondary | ICD-10-CM | POA: Insufficient documentation

## 2016-09-19 DIAGNOSIS — J45909 Unspecified asthma, uncomplicated: Secondary | ICD-10-CM | POA: Insufficient documentation

## 2016-09-19 HISTORY — DX: Unspecified asthma, uncomplicated: J45.909

## 2016-09-19 NOTE — ED Notes (Signed)
Pt advised he was sitting on the couch talking with his daughter on 09/18/16 at 2100 hrs when he states "Felt like heart paused, then started beat fast, then normal." Pt stated he felt as though he was having a panic attack. Pt admits to using marijuana earlier this date.

## 2016-09-19 NOTE — ED Provider Notes (Signed)
[facility]-EMERGENCY DEPT Provider Note   CSN: [id number] Arrival date & time: 09/19/16  0417     History   Chief Complaint Chief Complaint  Patient presents with  . Tachycardia    HPI Calvin Lewis is a 26 y.o. male who Presents emergency Department with chief complaint of palpitations. Patient states he has a past medical history of palpitations that has been ongoing for many years. The patient states that he fell asleep this evening for about an hour and when he awoke he had palpitations. He had 2 episodes of vomiting, but states that it is not abnormal for him to vomit because he has constant postnasal drip and a very sensitive gag reflex. He denies chest pain, diaphoresis. The patient was seen 2 weeks ago had no vomiting emergency department for chest pain and left arm paresthesia. The patient is extremely nervous because his father had an MI at age 19 and a quadruple bypass. Patient denies alcohol abuse, caffeine use, or stimulant abuse. He has no chest pain or palpitations at this time.  HPI  Past Medical History:  Diagnosis Date  . Asthma     Patient Active Problem List   Diagnosis Date Noted  . Acute bronchitis 03/11/2015  . Closed fracture of metatarsal bone of left foot with routine healing 06/18/2014  . Keratosis pilaris 03/31/2014  . Closed fracture of metacarpal bone 06/02/2008  . Cough 03/31/2008  . 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 No pertinent surgical history.     Home Medications    Prior to Admission medications   Medication Sig Start Date End Date Taking? Authorizing Provider  albuterol (PROVENTIL HFA;VENTOLIN HFA) 108 (90 BASE) MCG/ACT inhaler Inhale 2 puffs into the lungs every 6 (six) hours as needed for wheezing. 04/11/14   Monica Becton, MD  AMBULATORY NON FORMULARY MEDICATION Patient to be out of work for 1 - 3 days, can return sooner if feeling better. Please fax out-of-work clearance paperwork to Dr Mardelle Matte office if  needed. Thank you. 07/09/15   Sunnie Nielsen, DO  guaiFENesin-codeine (ROBITUSSIN AC) 100-10 MG/5ML syrup Take 5 mLs by mouth 3 (three) times daily as needed for cough. 07/09/15   Sunnie Nielsen, DO  ipratropium (ATROVENT) 0.03 % nasal spray Place 2 sprays into both nostrils every 12 (twelve) hours. 07/09/15   Sunnie Nielsen, DO    Family History Family History  Problem Relation Age of Onset  . Alcohol abuse Mother   . Depression Mother   . Hyperlipidemia Mother   . Alcohol abuse Father   . Heart attack Father   . Hyperlipidemia Father   . Hypertension Father     Social History Social History  Substance Use Topics  . Smoking status: Current Some Day Smoker    Packs/day: 0.50    Types: Cigarettes  . Smokeless tobacco: Former Neurosurgeon    Types: Snuff     Comment: Pt vaps.   . Alcohol use 3.6 oz/week    6 Cans of beer per week     Allergies   Patient has no known allergies.   Review of Systems Review of Systems  Ten systems reviewed and are negative for acute change, except as noted in the HPI.   Physical Exam Updated Vital Signs BP 136/85   Pulse 88   Temp 98.9 F (37.2 C) (Oral)   Resp 10   Ht 5\' 8"  (1.727 m)   Wt 99.8 kg   SpO2 98%   BMI 33.45 kg/m   Physical  Exam  Constitutional: He is oriented to person, place, and time. He appears well-developed and well-nourished. No distress.  HENT:  Head: Normocephalic and atraumatic.  Eyes: Conjunctivae and EOM are normal. Pupils are equal, round, and reactive to light. No scleral icterus.  Neck: Normal range of motion. Neck supple.  Cardiovascular: Normal rate, regular rhythm and normal heart sounds.   Pulmonary/Chest: Effort normal and breath sounds normal. No respiratory distress.  Abdominal: Soft. There is no tenderness.  Musculoskeletal: He exhibits no edema.  Neurological: He is alert and oriented to person, place, and time.  Skin: Skin is warm and dry. He is not diaphoretic.  Psychiatric: His behavior is  normal.  Nursing note and vitals reviewed.    ED Treatments / Results  Labs (all labs ordered are listed, but only abnormal results are displayed) Labs Reviewed - No data to display  EKG  EKG Interpretation  Date/Time:  Monday September 19 2016 04:23:41 EDT Ventricular Rate:  93 PR Interval:    QRS Duration: 88 QT Interval:  344 QTC Calculation: 428 R Axis:   33 Text Interpretation:  Sinus rhythm Low voltage, precordial leads Confirmed by POLLINA  MD, CHRISTOPHER ([id number]) on 09/19/2016 4:31:05 AM       Radiology No results found.  Procedures Procedures (including critical care time)  Medications Ordered in ED Medications - No data to display   Initial Impression / Assessment and Plan / ED Course  I have reviewed the triage vital signs and the nursing notes.  Pertinent labs & imaging results that were available during my care of the patient were reviewed by me and considered in my medical decision making (see chart for details).     Patient with normal EKG. He is hemodynamically stable with a history of chronic intermittent palpitations. I discussed the case with Dr. Senaida OresPaulina. We both feel that the patient is appropriate for outpatient follow-up for stress testing and Holter monitoring. Patient agrees with plan of care. Discussed return precautions.  Final Clinical Impressions(s) / ED Diagnoses   Final diagnoses:  Palpitations    New Prescriptions New Prescriptions   No medications on file     Arthor Captainbigail Jordane Hisle, PA-C 09/19/16 [phone]    Gilda Creasehristopher J Pollina, MD 09/19/16 [phone]

## 2016-09-19 NOTE — ED Triage Notes (Addendum)
Pt advised he was sitting on the couch talking with his daughter on 09/18/16 at 2100 hrs when he states "Felt like heart paused, then started beat fast, then normal." Pt stated he felt as though he was having a panic attack. Pt admits to using marijuana earlier this date.  

## 2016-09-19 NOTE — Discharge Instructions (Signed)
Contact a health care provider if: °You continue to have a fast or irregular heartbeat after 24 hours. °Your palpitations occur more often. °Get help right away if: °You have chest pain or shortness of breath. °You have a severe headache. °You feel dizzy or you faint. °

## 2016-09-21 ENCOUNTER — Emergency Department (INDEPENDENT_AMBULATORY_CARE_PROVIDER_SITE_OTHER)
Admission: EM | Admit: 2016-09-21 | Discharge: 2016-09-21 | Disposition: A | Discharge status: Home / Self Care | Payer: Self-pay | Source: Home / Self Care | Attending: Family Medicine | Admitting: Family Medicine

## 2016-09-21 ENCOUNTER — Encounter: Payer: Self-pay | Admitting: *Deleted

## 2016-09-21 DIAGNOSIS — R52 Pain, unspecified: Secondary | ICD-10-CM

## 2016-09-21 DIAGNOSIS — R110 Nausea: Secondary | ICD-10-CM

## 2016-09-21 NOTE — ED Triage Notes (Signed)
Pt c/o nausea and diarrhea x 2 days. He reports vomiting 2 days ago that has resolved.

## 2016-09-21 NOTE — ED Provider Notes (Signed)
CSN: [id number]     Arrival date & time 09/21/16  1616 History   First MD Initiated Contact with Patient 09/21/16 1630     Chief Complaint  Patient presents with  . Nausea   (Consider location/radiation/quality/duration/timing/severity/associated sxs/prior Treatment) HPI  Calvin Lewis is a 26 y.o. male presenting to UC with c/o nausea and diarrhea for 2 days.  He reports vomiting 2 days ago, which has since resolved but he does have some mild body aches today. He has been able to keep down some fluids and crackers. Today he is requesting a work note to return to work as he was not given one when he was in the hospital on 09/19/16 for same.     Past Medical History:  Diagnosis Date  . Asthma    History reviewed. No pertinent surgical history. Family History  Problem Relation Age of Onset  . Alcohol abuse Mother   . Depression Mother   . Hyperlipidemia Mother   . Alcohol abuse Father   . Heart attack Father   . Hyperlipidemia Father   . Hypertension Father    Social History  Substance Use Topics  . Smoking status: Current Some Day Smoker    Packs/day: 1.00    Types: Cigarettes  . Smokeless tobacco: Former Neurosurgeon    Types: Snuff     Comment: Pt vaps.   . Alcohol use 3.6 oz/week    6 Cans of beer per week    Review of Systems  Constitutional: Negative for chills, fatigue and fever.  Cardiovascular: Negative for chest pain and palpitations.  Gastrointestinal: Positive for nausea. Negative for abdominal pain, constipation, diarrhea and vomiting.  Musculoskeletal: Positive for arthralgias and myalgias.    Allergies  Patient has no known allergies.  Home Medications   Prior to Admission medications   Medication Sig Start Date End Date Taking? Authorizing Provider  albuterol (PROVENTIL HFA;VENTOLIN HFA) 108 (90 BASE) MCG/ACT inhaler Inhale 2 puffs into the lungs every 6 (six) hours as needed for wheezing. 04/11/14   Monica Becton, MD   Meds Ordered and Administered  this Visit  Medications - No data to display  BP 105/72 (BP Location: Left Arm)   Pulse 98   Temp 98.2 F (36.8 C) (Oral)   Resp 16   Wt 211 lb (95.7 kg)   SpO2 98%   BMI 32.08 kg/m  No data found.   Physical Exam  Constitutional: He is oriented to person, place, and time. He appears well-developed and well-nourished. No distress.  HENT:  Head: Normocephalic and atraumatic.  Right Ear: Tympanic membrane normal.  Left Ear: Tympanic membrane normal.  Nose: Nose normal.  Mouth/Throat: Uvula is midline, oropharynx is clear and moist and mucous membranes are normal.  Eyes: EOM are normal.  Neck: Normal range of motion. Neck supple.  Cardiovascular: Normal rate and regular rhythm.   Pulmonary/Chest: Effort normal and breath sounds normal. No respiratory distress. He has no wheezes. He has no rales.  Musculoskeletal: Normal range of motion.  Neurological: He is alert and oriented to person, place, and time.  Skin: Skin is warm and dry. He is not diaphoretic.  Psychiatric: He has a normal mood and affect. His behavior is normal.  Nursing note and vitals reviewed.   Urgent Care Course     Procedures (including critical care time)  Labs Review Labs Reviewed - No data to display  Imaging Review No results found.   MDM   1. Nausea   2. Body aches  Pt requesting a work note since being out of work since Monday due to nausea and vomiting.  Pt appears well, NAD.  Work note provided.  f/u with PCP as needed.     Junius Finnerrin O'Malley, PA-C 09/21/16 1652

## 2016-10-05 IMAGING — CR DG FOOT COMPLETE 3+V*L*
3 series · 3 of 3 positions shown · non-contrast
Comparison: None.

CLINICAL DATA: Follow-up left foot fracture

EXAM:
LEFT FOOT - COMPLETE 3+ VIEW

[view not recorded (1 of 3)]
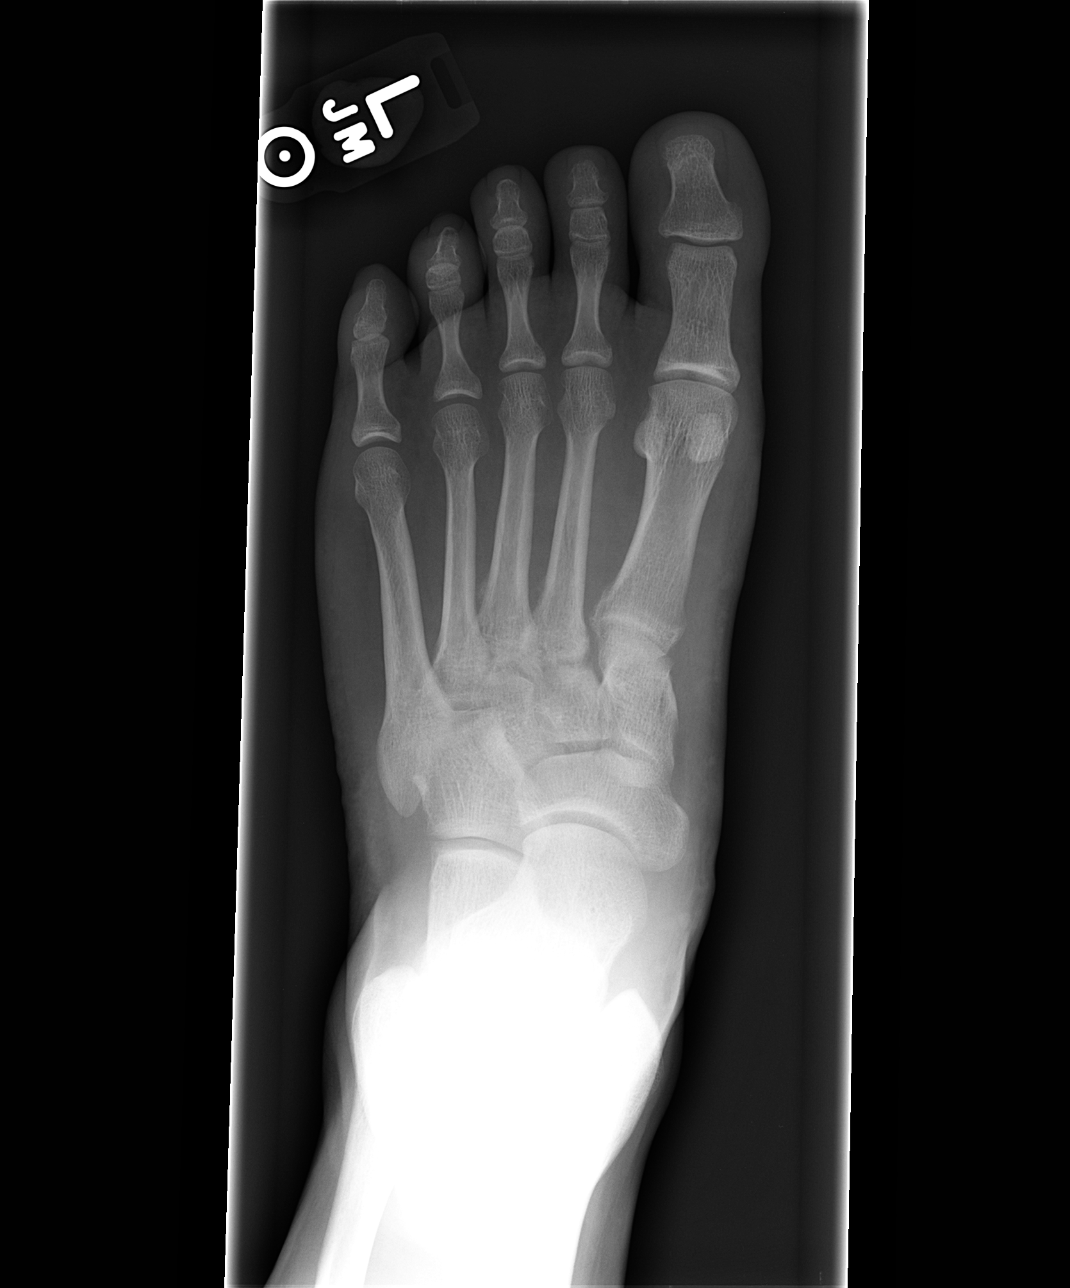

[view not recorded (2 of 3)]
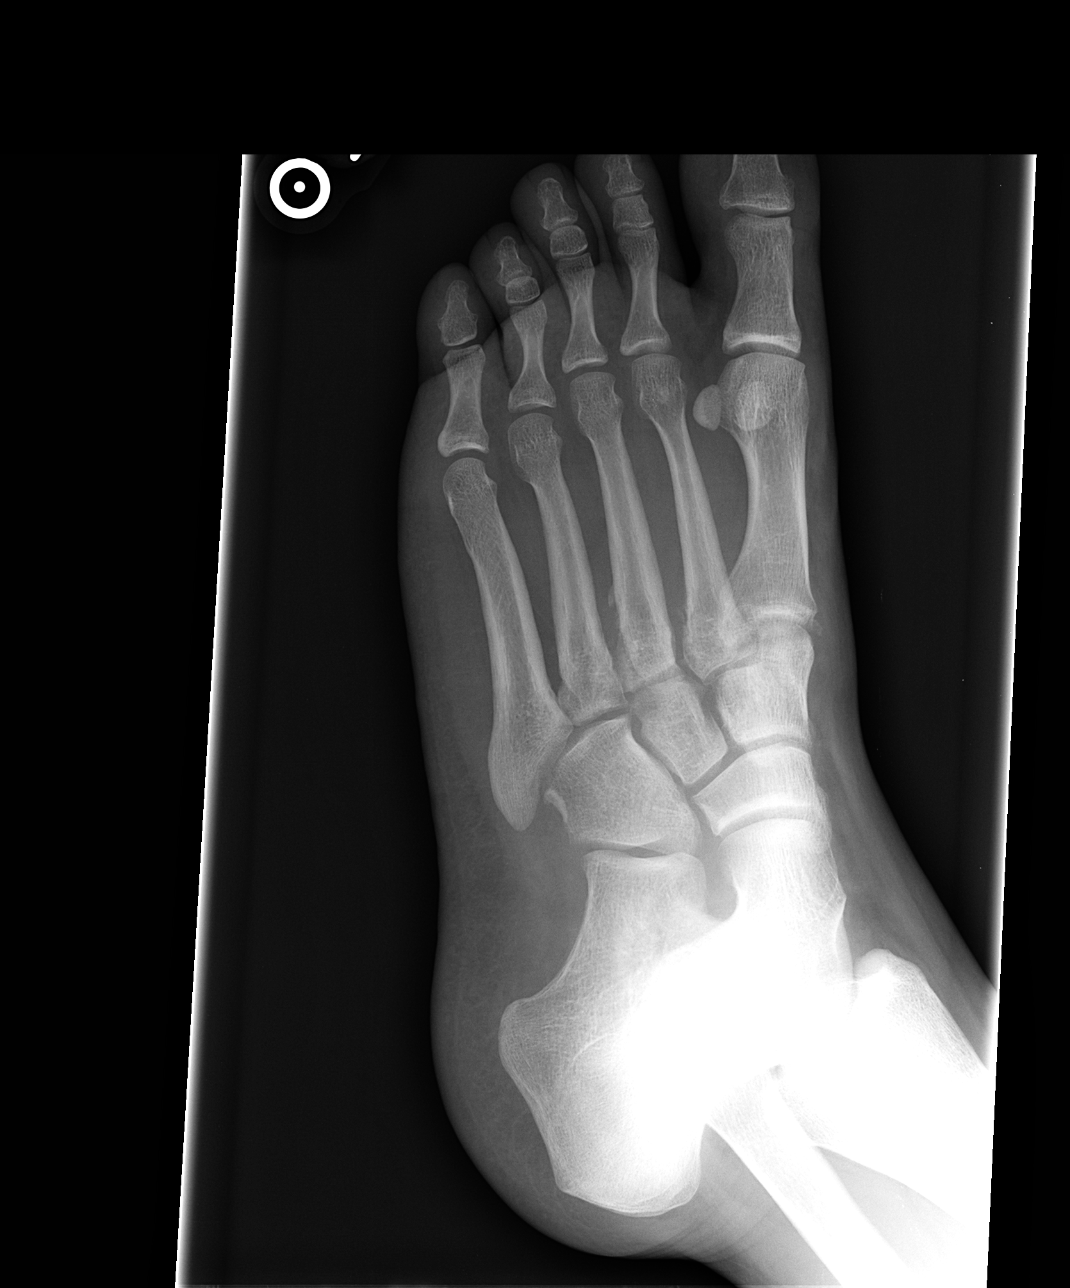

[view not recorded (3 of 3)]
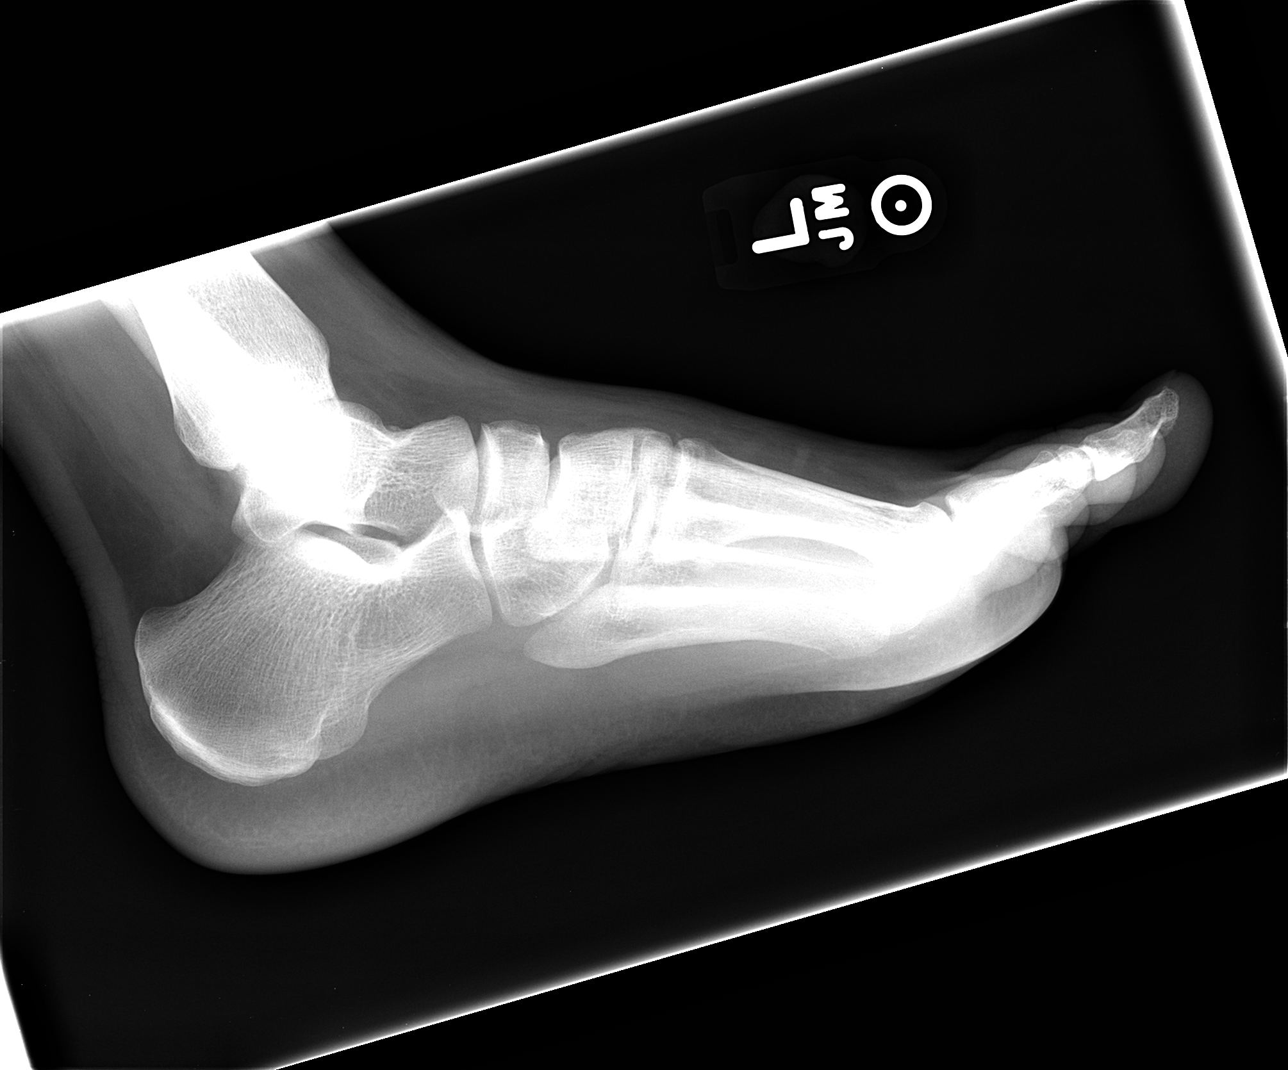

[3 of 3 positions shown; findings below may reference images not displayed]

FINDINGS: There are ossific fragments along the lateral base of the second
metatarsal. There is a small well corticated fragment adjacent to
the medial aspect of the first TMT joint.

There is mild callus formation along the lateral proximal shaft of
the second and third metatarsals.

There is no evidence of arthropathy or other focal bone abnormality.
Soft tissues are unremarkable.
IMPRESSION: 1. There is mild callus formation along the lateral proximal shaft
of the second and third metatarsals likely reflecting healing
fractures. No fracture cleft is identified.
2. There are ossific fragments along the lateral base of the second
metatarsal. There is a small well corticated fragment adjacent to
the medial aspect of the first TMT joint. These likely reflect
changes from prior injury.

## 2019-09-10 ENCOUNTER — Ambulatory Visit: Payer: Self-pay | Admitting: Medical-Surgical

## 2019-09-12 ENCOUNTER — Ambulatory Visit (INDEPENDENT_AMBULATORY_CARE_PROVIDER_SITE_OTHER): Payer: Managed Care, Other (non HMO) | Admitting: Nurse Practitioner

## 2019-09-12 ENCOUNTER — Other Ambulatory Visit: Payer: Self-pay

## 2019-09-12 ENCOUNTER — Encounter: Payer: Self-pay | Admitting: Nurse Practitioner

## 2019-09-12 VITALS — BP 138/78 | HR 102 | Temp 98.5°F | Ht 67.25 in | Wt 207.7 lb

## 2019-09-12 DIAGNOSIS — S61211D Laceration without foreign body of left index finger without damage to nail, subsequent encounter: Secondary | ICD-10-CM

## 2019-09-12 DIAGNOSIS — Z113 Encounter for screening for infections with a predominantly sexual mode of transmission: Secondary | ICD-10-CM

## 2019-09-12 NOTE — Progress Notes (Signed)
Acute Office Visit  Subjective:    Patient ID: Calvin Lewis, male    DOB: September 17, 1990, 29 y.o.   MRN: [id number]  Chief Complaint  Patient presents with  . Establish Care    with Samuel Bouche, FNP  . Laceration    HPI Patient is in today for evaluation of laceration to the tip of his left index finger. He received a cut approximately 7 days ago from a box cutter. At that time he was evaluated at [facility] ED and the area was sutured. He reports he was told to remove the sutures in 10 days and plans to do this himself over the weekend. He does need a note to return to full duty at work on Monday.   Patient requests STI screening today for GC/Chlam. He denies any symptoms or specific concerns at this time.   Past Medical History:  Diagnosis Date  . Asthma     No past surgical history on file.  Family History  Problem Relation Age of Onset  . Alcohol abuse Mother   . Depression Mother   . Hyperlipidemia Mother   . Alcohol abuse Father   . Heart attack Father   . Hyperlipidemia Father   . Hypertension Father     Social History   Socioeconomic History  . Marital status: Single    Spouse name: Not on file  . Number of children: Not on file  . Years of education: Not on file  . Highest education level: Not on file  Occupational History  . Not on file  Tobacco Use  . Smoking status: Current Some Day Smoker    Types: Cigarettes  . Smokeless tobacco: Former Systems developer    Types: Snuff  . Tobacco comment: rarely smokes cigarettes  Substance and Sexual Activity  . Alcohol use: Yes    Alcohol/week: 7.0 - 10.0 standard drinks    Types: 7 - 10 Cans of beer per week  . Drug use: Not Currently    Types: Marijuana, Cocaine  . Sexual activity: Not Currently  Other Topics Concern  . Not on file  Social History Narrative  . Not on file   Social Determinants of Health   Financial Resource Strain:   . Difficulty of Paying Living Expenses:   Food Insecurity:   . Worried About Ship broker in the Last Year:   . Arboriculturist in the Last Year:   Transportation Needs:   . Film/video editor (Medical):   Marland Kitchen Lack of Transportation (Non-Medical):   Physical Activity:   . Days of Exercise per Week:   . Minutes of Exercise per Session:   Stress:   . Feeling of Stress :   Social Connections:   . Frequency of Communication with Friends and Family:   . Frequency of Social Gatherings with Friends and Family:   . Attends Religious Services:   . Active Member of Clubs or Organizations:   . Attends Archivist Meetings:   Marland Kitchen Marital Status:   Intimate Partner Violence:   . Fear of Current or Ex-Partner:   . Emotionally Abused:   Marland Kitchen Physically Abused:   . Sexually Abused:     Outpatient Medications Prior to Visit  Medication Sig Dispense Refill  . albuterol (PROVENTIL HFA;VENTOLIN HFA) 108 (90 BASE) MCG/ACT inhaler Inhale 2 puffs into the lungs every 6 (six) hours as needed for wheezing. 2 Inhaler 11   No facility-administered medications prior to visit.    Allergies  Allergen Reactions  . Cat Hair Extract   . Dog Epithelium     Review of Systems  Constitutional: Negative for chills, fatigue and fever.  HENT: Negative for congestion and hearing loss.   Respiratory: Negative for cough and shortness of breath.   Cardiovascular: Negative for chest pain and palpitations.  Gastrointestinal: Negative for abdominal distention and abdominal pain.  Genitourinary: Negative for difficulty urinating, dysuria, genital sores, penile pain and penile swelling.  Skin: Positive for wound.  Neurological: Negative for dizziness and headaches.  Psychiatric/Behavioral: The patient is not nervous/anxious.        Objective:    Physical Exam Vitals and nursing note reviewed.  Constitutional:      Appearance: Normal appearance.  HENT:     Head: Normocephalic.  Eyes:     Conjunctiva/sclera: Conjunctivae normal.     Pupils: Pupils are equal, round, and reactive to  light.  Cardiovascular:     Rate and Rhythm: Normal rate and regular rhythm.     Pulses: Normal pulses.     Heart sounds: Normal heart sounds.  Pulmonary:     Effort: Pulmonary effort is normal.     Breath sounds: Normal breath sounds.  Abdominal:     General: Abdomen is flat. Bowel sounds are normal.     Palpations: Abdomen is soft.  Musculoskeletal:        General: Normal range of motion.     Cervical back: Normal range of motion.  Skin:    General: Skin is warm and dry.     Capillary Refill: Capillary refill takes less than 2 seconds.       Neurological:     General: No focal deficit present.     Mental Status: He is alert and oriented to person, place, and time.  Psychiatric:        Mood and Affect: Mood normal.        Behavior: Behavior normal.        Thought Content: Thought content normal.        Judgment: Judgment normal.     BP 138/78   Pulse (!) 102   Temp 98.5 F (36.9 C) (Oral)   Ht 5' 7.25" (1.708 m)   Wt 207 lb 11.2 oz (94.2 kg)   SpO2 97%   BMI 32.29 kg/m  Wt Readings from Last 3 Encounters:  09/12/19 207 lb 11.2 oz (94.2 kg)  09/21/16 211 lb (95.7 kg)  09/19/16 220 lb (99.8 kg)    Health Maintenance Due  Topic Date Due  . HIV Screening  Never done  . TETANUS/TDAP  Never done  . INFLUENZA VACCINE  Never done    There are no preventive care reminders to display for this patient.   No results found for: TSH No results found for: WBC, HGB, HCT, MCV, PLT No results found for: NA, K, CHLORIDE, CO2, GLUCOSE, BUN, CREATININE, BILITOT, ALKPHOS, AST, ALT, PROT, ALBUMIN, CALCIUM, ANIONGAP, EGFR, GFR No results found for: CHOL No results found for: HDL No results found for: LDLCALC No results found for: TRIG No results found for: CHOLHDL No results found for: HGBA1C     Assessment & Plan:   1. Laceration of left index finger w/o foreign body w/o damage to nail, subsequent encounter Laceration healing nicely.  Area well approximated without any  signs of infection or drainage. Discussed with the patient the importance of keeping the area clean and dry until it is time for the sutures to be removed. Letter provided for  return to work starting Monday, 09/16/2019. Encouraged patient to contact the office if he would prefer [place] to remove the sutures or notices any difficulty, drainage, signs of infection.   2. Routine screening for STI (sexually transmitted infection) Urine collected for testing. Will notify patient of results once received and follow-up as needed. - C. trachomatis/N. gonorrhoeae RNA  Patient is a new patient and will need to schedule appointment for annual physical exam with labs with Joy within the next 3 months.  Orma Render, NP

## 2019-09-12 NOTE — Patient Instructions (Signed)
Suture Removal, Care After This sheet gives you information about how to care for yourself after your procedure. Your health care provider may also give you more specific instructions. If you have problems or questions, contact your health care provider. What can I expect after the procedure? After your stitches (sutures) are removed, it is common to have:  Some discomfort and swelling in the area.  Slight redness in the area. Follow these instructions at home: If you have a bandage:  Wash your hands with soap and water before you change your bandage (dressing). If soap and water are not available, use hand sanitizer.  Change your dressing as told by your health care provider. If your dressing becomes wet or dirty, or develops a bad smell, change it as soon as possible.  If your dressing sticks to your skin, soak it in warm water to loosen it. Wound care   Check your wound every day for signs of infection. Check for: ? More redness, swelling, or pain. ? Fluid or blood. ? Warmth. ? Pus or a bad smell.  Wash your hands with soap and water before and after touching your wound.  Apply cream or ointment only as directed by your health care provider. If you are using cream or ointment, wash the area with soap and water 2 times a day to remove all the cream or ointment. Rinse off the soap and pat the area dry with a clean towel.  If you have skin glue or adhesive strips on your wound, leave these closures in place. They may need to stay in place for 2 weeks or longer. If adhesive strip edges start to loosen and curl up, you may trim the loose edges. Do not remove adhesive strips completely unless your health care provider tells you to do that.  Keep the wound area dry and clean. Do not take baths, swim, or use a hot tub until your health care provider approves.  Continue to protect the wound from injury.  Do not pick at your wound. Picking can cause an infection.  When your wound has  completely healed, wear sunscreen over it or cover it with clothing when you are outside. New scars get sunburned easily, which can make scarring worse. General instructions  Take over-the-counter and prescription medicines only as told by your health care provider.  Keep all follow-up visits as told by your health care provider. This is important. Contact a health care provider if:  You have redness, swelling, or pain around your wound.  You have fluid or blood coming from your wound.  Your wound feels warm to the touch.  You have pus or a bad smell coming from your wound.  Your wound opens up. Get help right away if:  You have a fever.  You have redness that is spreading from your wound. Summary  After your sutures are removed, it is common to have some discomfort and swelling in the area.  Wash your hands with soap and water before you change your bandage (dressing).  Keep the wound area dry and clean. Do not take baths, swim, or use a hot tub until your health care provider approves. This information is not intended to replace advice given to you by your health care provider. Make sure you discuss any questions you have with your health care provider. Document Revised: 06/02/2017 Document Reviewed: 07/26/2016 [employer] Patient Education  2020 [employer]  

## 2019-09-13 ENCOUNTER — Encounter: Payer: Self-pay | Admitting: Nurse Practitioner

## 2019-09-13 LAB — C. TRACHOMATIS/N. GONORRHOEAE RNA
C. trachomatis RNA, TMA: NOT DETECTED
N. gonorrhoeae RNA, TMA: NOT DETECTED
